# Patient Record
Sex: Male | Born: 1970 | Race: White | Hispanic: No | Marital: Single | Smoking: Never smoker
Health system: Southern US, Community
[De-identification: ages and names within clinical notes are randomized; demographics above are authoritative.]

## PROBLEM LIST (undated history)

## (undated) DIAGNOSIS — F419 Anxiety disorder, unspecified: Secondary | ICD-10-CM

## (undated) DIAGNOSIS — E7800 Pure hypercholesterolemia, unspecified: Secondary | ICD-10-CM

## (undated) DIAGNOSIS — I10 Essential (primary) hypertension: Secondary | ICD-10-CM

## (undated) HISTORY — PX: COLONOSCOPY: SHX174

## (undated) HISTORY — PX: ARTHROSCOPIC REPAIR ACL: SUR80

---

## 2018-12-11 ENCOUNTER — Encounter: Payer: Self-pay | Admitting: Podiatry

## 2018-12-11 ENCOUNTER — Other Ambulatory Visit: Payer: Self-pay

## 2018-12-11 ENCOUNTER — Ambulatory Visit (INDEPENDENT_AMBULATORY_CARE_PROVIDER_SITE_OTHER): Payer: No Typology Code available for payment source | Admitting: Podiatry

## 2018-12-11 VITALS — BP 131/93 | HR 61 | Temp 97.7°F

## 2018-12-11 DIAGNOSIS — E669 Obesity, unspecified: Secondary | ICD-10-CM | POA: Insufficient documentation

## 2018-12-11 DIAGNOSIS — L600 Ingrowing nail: Secondary | ICD-10-CM

## 2018-12-11 DIAGNOSIS — B351 Tinea unguium: Secondary | ICD-10-CM

## 2018-12-11 DIAGNOSIS — I10 Essential (primary) hypertension: Secondary | ICD-10-CM | POA: Insufficient documentation

## 2018-12-11 DIAGNOSIS — E785 Hyperlipidemia, unspecified: Secondary | ICD-10-CM | POA: Insufficient documentation

## 2018-12-11 MED ORDER — TERBINAFINE HCL 250 MG PO TABS: 250.0000 mg | ORAL_TABLET | Freq: Every day | ORAL | 0 refills | Status: DC

## 2018-12-11 MED ORDER — NEOMYCIN-POLYMYXIN-HC 1 % OT SOLN: OTIC | 1 refills | Status: AC

## 2018-12-11 MED ORDER — NEOMYCIN-POLYMYXIN-HC 3.5-10000-1 OP SUSP: OPHTHALMIC | 1 refills | Status: DC

## 2018-12-11 NOTE — Progress Notes (Signed)
   Subjective:    Patient ID: James Caldwell, male    DOB: 10-22-1970, 48 y.o.   MRN: [id number]  HPI    Review of Systems  All other systems reviewed and are negative.      Objective:   Physical Exam        Assessment & Plan:

## 2018-12-11 NOTE — Patient Instructions (Signed)

## 2018-12-15 NOTE — Progress Notes (Signed)
Subjective:   Patient ID: James Caldwell, male   DOB: 48 y.o.   MRN: [id number]   HPI Patient presents stating I have severely thickened big toenails on both feet and my second right is thickened and painful when pressed.  Patient is states they have all been bothersome and he is tried to trim them himself without relief and had 1 previous nail removed    Review of Systems  All other systems reviewed and are negative.       Objective:  Physical Exam Vitals signs and nursing note reviewed.  Constitutional:      Appearance: He is well-developed.  Pulmonary:     Effort: Pulmonary effort is normal.  Musculoskeletal: Normal range of motion.  Skin:    General: Skin is warm.  Neurological:     Mental Status: He is alert.     Neurovascular status intact muscle strength adequate range of motion within normal limits with patient noted to have severely thickened hallux nails bilateral and second right with subungual debris and pain with palpation.     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  I explained procedure risk and he wants surgery and today I infiltrated the left hallux 6 mg like Marcaine mixture in the right hallux second toes 120 mg like Marcaine mixture sterile preps applied to the toe and using sterile instrumentation I removed the hallux nail bilateral second right exposed matrix and applied phenol 5 applications 30 seconds followed by alcohol by sterile dressing.  Gave instructions on soaks and to leave dressings on 24 hours unless they should start to throb and remove them earlier and gave prescriptions for drops and encouraged him to call with questions

## 2019-01-01 ENCOUNTER — Other Ambulatory Visit: Payer: Self-pay

## 2019-01-01 ENCOUNTER — Ambulatory Visit (INDEPENDENT_AMBULATORY_CARE_PROVIDER_SITE_OTHER): Payer: No Typology Code available for payment source | Admitting: Podiatry

## 2019-01-01 DIAGNOSIS — L600 Ingrowing nail: Secondary | ICD-10-CM

## 2019-01-01 NOTE — Patient Instructions (Signed)

## 2019-02-05 NOTE — Progress Notes (Signed)
Patient is here today for follow-up appointment, recent procedure performed on 12/11/2018, removal of ingrown toenails.  He states that he feels like everything is healing well and his toes are starting to feel better.  No redness, no erythema, no swelling, no drainage, no other signs and symptoms of infection.  Area is scabbing over at this time and appears to be healing well.  Discussed signs and symptoms of infection with patient, verbal and written instructions were given.  He is to follow-up as needed with any acute symptom changes.

## 2021-06-13 ENCOUNTER — Emergency Department (HOSPITAL_BASED_OUTPATIENT_CLINIC_OR_DEPARTMENT_OTHER): Payer: No Typology Code available for payment source

## 2021-06-13 ENCOUNTER — Emergency Department (HOSPITAL_BASED_OUTPATIENT_CLINIC_OR_DEPARTMENT_OTHER)
Admission: EM | Admit: 2021-06-13 | Discharge: 2021-06-13 | Disposition: A | Discharge status: Home / Self Care | Payer: No Typology Code available for payment source | Attending: Emergency Medicine | Admitting: Emergency Medicine

## 2021-06-13 ENCOUNTER — Encounter (HOSPITAL_BASED_OUTPATIENT_CLINIC_OR_DEPARTMENT_OTHER): Payer: Self-pay | Admitting: Emergency Medicine

## 2021-06-13 ENCOUNTER — Other Ambulatory Visit: Payer: Self-pay

## 2021-06-13 ENCOUNTER — Emergency Department (HOSPITAL_BASED_OUTPATIENT_CLINIC_OR_DEPARTMENT_OTHER): Payer: No Typology Code available for payment source | Admitting: Radiology

## 2021-06-13 DIAGNOSIS — L03115 Cellulitis of right lower limb: Secondary | ICD-10-CM | POA: Insufficient documentation

## 2021-06-13 DIAGNOSIS — L0390 Cellulitis, unspecified: Secondary | ICD-10-CM

## 2021-06-13 DIAGNOSIS — Z20822 Contact with and (suspected) exposure to covid-19: Secondary | ICD-10-CM | POA: Diagnosis not present

## 2021-06-13 DIAGNOSIS — J069 Acute upper respiratory infection, unspecified: Secondary | ICD-10-CM | POA: Diagnosis not present

## 2021-06-13 DIAGNOSIS — I10 Essential (primary) hypertension: Secondary | ICD-10-CM | POA: Insufficient documentation

## 2021-06-13 DIAGNOSIS — R0602 Shortness of breath: Secondary | ICD-10-CM | POA: Diagnosis present

## 2021-06-13 DIAGNOSIS — R519 Headache, unspecified: Secondary | ICD-10-CM | POA: Diagnosis not present

## 2021-06-13 HISTORY — DX: Pure hypercholesterolemia, unspecified: E78.00

## 2021-06-13 HISTORY — DX: Essential (primary) hypertension: I10

## 2021-06-13 LAB — CBC WITH DIFFERENTIAL/PLATELET
Abs Immature Granulocytes: 0.04 10*3/uL (ref 0.00–0.07)
Basophils Absolute: 0.1 10*3/uL (ref 0.0–0.1)
Basophils Relative: 1 %
Eosinophils Absolute: 0.1 10*3/uL (ref 0.0–0.5)
Eosinophils Relative: 1 %
HCT: 41.5 % (ref 39.0–52.0)
Hemoglobin: 14.8 g/dL (ref 13.0–17.0)
Immature Granulocytes: 0 %
Lymphocytes Relative: 24 %
Lymphs Abs: 2.2 10*3/uL (ref 0.7–4.0)
MCH: 31 pg (ref 26.0–34.0)
MCHC: 35.7 g/dL (ref 30.0–36.0)
MCV: 87 fL (ref 80.0–100.0)
Monocytes Absolute: 0.7 10*3/uL (ref 0.1–1.0)
Monocytes Relative: 8 %
Neutro Abs: 6.2 10*3/uL (ref 1.7–7.7)
Neutrophils Relative %: 66 %
Platelets: 216 10*3/uL (ref 150–400)
RBC: 4.77 MIL/uL (ref 4.22–5.81)
RDW: 12.3 % (ref 11.5–15.5)
WBC: 9.3 10*3/uL (ref 4.0–10.5)
nRBC: 0 % (ref 0.0–0.2)

## 2021-06-13 LAB — BASIC METABOLIC PANEL
Anion gap: 7 (ref 5–15)
BUN: 12 mg/dL (ref 6–20)
CO2: 30 mmol/L (ref 22–32)
Calcium: 9.2 mg/dL (ref 8.9–10.3)
Chloride: 103 mmol/L (ref 98–111)
Creatinine, Ser: 0.95 mg/dL (ref 0.61–1.24)
GFR, Estimated: >60 mL/min (ref >60)
Glucose, Bld: 85 mg/dL (ref 70–99)
Potassium: 4 mmol/L (ref 3.5–5.1)
Sodium: 140 mmol/L (ref 135–145)

## 2021-06-13 LAB — RESP PANEL BY RT-PCR (FLU A&B, COVID) ARPGX2
Influenza A by PCR: NEGATIVE
Influenza B by PCR: NEGATIVE
SARS Coronavirus 2 by RT PCR: NEGATIVE

## 2021-06-13 MED ORDER — ACETAMINOPHEN 325 MG PO TABS
650.0000 mg | ORAL_TABLET | Freq: Once | ORAL | Status: AC
  Administered 2021-06-13: 650 mg via ORAL
  Filled 2021-06-13: qty 2

## 2021-06-13 MED ORDER — KETOROLAC TROMETHAMINE 15 MG/ML IJ SOLN
15.0000 mg | Freq: Once | INTRAMUSCULAR | Status: AC
Start: 2021-06-13 — End: 2021-06-13
  Administered 2021-06-13: 15 mg via INTRAVENOUS
  Filled 2021-06-13: qty 1

## 2021-06-13 MED ORDER — DOXYCYCLINE HYCLATE 100 MG PO CAPS: 100.0000 mg | ORAL_CAPSULE | Freq: Two times a day (BID) | ORAL | 0 refills | Status: AC

## 2021-06-13 MED ORDER — CEPHALEXIN 500 MG PO CAPS: 500.0000 mg | ORAL_CAPSULE | Freq: Four times a day (QID) | ORAL | 0 refills | Status: AC

## 2021-06-13 MED ORDER — SODIUM CHLORIDE 0.9 % IV SOLN
2.0000 g | Freq: Once | INTRAVENOUS | Status: AC
  Administered 2021-06-13: 2 g via INTRAVENOUS
  Filled 2021-06-13: qty 20

## 2021-06-13 MED ORDER — NAPROXEN 500 MG PO TABS: 500.0000 mg | ORAL_TABLET | Freq: Two times a day (BID) | ORAL | 0 refills | Status: AC

## 2021-06-13 NOTE — ED Notes (Signed)
EMT-P provided AVS using Teachback Method. Patient verbalizes understanding of Discharge Instructions. Opportunity for Questioning and Answers were provided by EMT-P. Patient Discharged from ED.  ? ?

## 2021-06-13 NOTE — ED Triage Notes (Addendum)
Rt ankle pain and cough  ear pain an congestion x 2 weeks, coming from the [facility]  and his bp was high so was sent over here for further teststing

## 2021-06-13 NOTE — Discharge Instructions (Addendum)
Please follow-up in 2 days for reassessment of your skin infection.  Please take antibiotics as prescribed.  Keep the leg elevated and you can use ice packs for swelling.  Use the ice packs for 20 minutes at a time and do not apply directly to the skin.  If you have any new or worsening symptoms in the meantime including any persistent fevers, severe pain, changes in sensation to your foot or leg, or any new or worsening concerns please return to the emergency department immediately.

## 2021-06-13 NOTE — ED Provider Notes (Signed)
[facility]   CSN: [id number] Arrival date & time: 06/13/21  1725     History  Chief Complaint  Patient presents with   Ankle Pain   Cough    James NeedleChristopher Caldwell is a 51 y.o. male.  HPI  51 year old male with history of hypertension, hyperlipidemia, obesity, presents the emergency department today with multiple complaints.  URI symptoms: Patient states he has had a cough for the last 3 weeks.  He also reports intermittent headaches, ear fullness.  He has been taking Mucinex which is somewhat improved his symptoms however symptoms returned a few days ago.  Leg pain: Patient reports that about 5 to 6 days ago he woke up with pain and swelling to the right lower extremity.  The pain is located mainly to the ankle and foot but actually radiates up his whole leg.  There are no reported sensory changes.  He denies any chest pain, shortness of breath.  He denies any history of DVT/PE, recent surgery or hospital admission.  He does report that he drives during extended period of time and least 3 hours a day from his commute to [place].  At times he does drive up to 7 hours/day.  Home Medications Prior to Admission medications   Medication Sig Start Date End Date Taking? Authorizing Provider  cephALEXin (KEFLEX) 500 MG capsule Take 1 capsule (500 mg total) by mouth 4 (four) times daily for 7 days. 06/13/21 06/20/21 Yes Alece Koppel S, PA-C  doxycycline (VIBRAMYCIN) 100 MG capsule Take 1 capsule (100 mg total) by mouth 2 (two) times daily for 7 days. 06/13/21 06/20/21 Yes Chiante Peden S, PA-C  naproxen (NAPROSYN) 500 MG tablet Take 1 tablet (500 mg total) by mouth 2 (two) times daily for 7 days. 06/13/21 06/20/21 Yes Orval Dortch S, PA-C  NEOMYCIN-POLYMYXIN-HYDROCORTISONE (CORTISPORIN) 1 % SOLN OTIC solution Apply 1-2 drops to toe BID after soaking 12/11/18   Regal, Kirstie PeriNorman S, DPM  terbinafine (LAMISIL) 250 MG tablet Take 1 tablet (250 mg total)  by mouth daily. 12/11/18   Lenn Sinkegal, Norman S, DPM      Allergies    Oxycodone-acetaminophen    Review of Systems   Review of Systems  Constitutional:  Positive for fever. Negative for chills.  HENT:  Positive for congestion, ear pain (fullness) and rhinorrhea. Negative for sore throat.   Eyes:  Negative for pain and visual disturbance.  Respiratory:  Positive for cough. Negative for shortness of breath.   Cardiovascular:  Positive for leg swelling. Negative for chest pain.  Gastrointestinal:  Negative for abdominal pain, constipation, diarrhea, nausea and vomiting.  Genitourinary:  Negative for dysuria and hematuria.  Musculoskeletal:        Right leg pain  Skin:  Positive for color change. Negative for rash and wound.  Neurological:  Negative for weakness and numbness.  All other systems reviewed and are negative.  Physical Exam Updated Vital Signs BP (!) 151/100    Pulse 83    Temp (!) 100.6 F (38.1 C) (Oral)    Resp 14    Ht 5\' 8"  (1.727 m)    Wt 107 kg    SpO2 99%    BMI 35.88 kg/m  Physical Exam Vitals and nursing note reviewed.  Constitutional:      General: He is not in acute distress.    Appearance: He is well-developed.  HENT:     Head: Normocephalic and atraumatic.     Right Ear: Tympanic membrane normal.  Left Ear: Tympanic membrane normal.  Eyes:     Conjunctiva/sclera: Conjunctivae normal.  Cardiovascular:     Rate and Rhythm: Normal rate and regular rhythm.     Pulses:          Dorsalis pedis pulses are 2+ on the right side and 2+ on the left side.     Heart sounds: Normal heart sounds. No murmur heard. Pulmonary:     Effort: Pulmonary effort is normal. No respiratory distress.     Breath sounds: Normal breath sounds. No wheezing, rhonchi or rales.  Abdominal:     General: Bowel sounds are normal.     Palpations: Abdomen is soft.     Tenderness: There is no abdominal tenderness. There is no guarding or rebound.  Musculoskeletal:     Cervical back: Neck  supple.     Right lower leg: Edema present.     Comments: Erythema located to the right lateral ankle there migrates to the midfoot.  There is a small superficial abrasion to the dorsum of the right foot.  Skin:    General: Skin is warm and dry.     Capillary Refill: Capillary refill takes less than 2 seconds.  Neurological:     Mental Status: He is alert.  Psychiatric:        Mood and Affect: Mood normal.    ED Results / Procedures / Treatments   Labs (all labs ordered are listed, but only abnormal results are displayed) Labs Reviewed  RESP PANEL BY RT-PCR (FLU A&B, COVID) [id number]  CBC WITH DIFFERENTIAL/PLATELET  BASIC METABOLIC PANEL    EKG None  Radiology DG Chest 2 View  Result Date: 06/13/2021 CLINICAL DATA:  Provided history: Shortness of breath. Additional history provided: Patient reports right ankle pain and cough, ear pain and congestion for 2 weeks. EXAM: CHEST - 2 VIEW COMPARISON:  Prior chest radiographs 10/22/2004 (images available, report unavailable). FINDINGS: Heart size within normal limits. No appreciable airspace consolidation. No evidence of pleural effusion or pneumothorax. No acute bony abnormality identified. Degenerative changes of the spine. IMPRESSION: No evidence of active cardiopulmonary disease. Electronically Signed   By: Kellie Simmering D.O.   On: 06/13/2021 18:21   US Venous Img Lower Right (DVT Study)  Result Date: 06/13/2021 CLINICAL DATA:  Right ankle pain and edema, hypertension EXAM: RIGHT LOWER EXTREMITY VENOUS DOPPLER ULTRASOUND TECHNIQUE: Gray-scale sonography with compression, as well as color and duplex ultrasound, were performed to evaluate the deep venous system(s) from the level of the common femoral vein through the popliteal and proximal calf veins. COMPARISON:  None. FINDINGS: VENOUS Normal compressibility of the common femoral, superficial femoral, and popliteal veins, as well as the visualized calf veins. Visualized portions of profunda  femoral vein and great saphenous vein unremarkable. No filling defects to suggest DVT on grayscale or color Doppler imaging. Doppler waveforms show normal direction of venous flow, normal respiratory plasticity and response to augmentation. Limited views of the contralateral common femoral vein are unremarkable. OTHER None. Limitations: none IMPRESSION: No evidence of deep venous thrombosis in the right lower extremity. Electronically Signed   By: Ilona Sorrel M.D.   On: 06/13/2021 19:48    Procedures Procedures    Medications Ordered in ED Medications  cefTRIAXone (ROCEPHIN) 2 g in sodium chloride 0.9 % 100 mL IVPB (2 g Intravenous New Bag/Given 06/13/21 2031)  acetaminophen (TYLENOL) tablet 650 mg (has no administration in time range)  ketorolac (TORADOL) 15 MG/ML injection 15 mg (has no administration in time  range)    ED Course/ Medical Decision Making/ A&P                           Medical Decision Making  51 year old male presents for evaluation of multiple complaints including URI symptoms and right lower extremity swelling and redness.  Reviewed/interpreted labs CBC is without leukocytosis or anemia BMP is grossly reassuring  Reviewed x-rays that were completed urgent care prior to arrival and there is no bony abnormalities to the foot or ankle noted.  He additionally did not have any fluid noted within the ankle joint on x-ray.  Right lower extremity ultrasound did not show any evidence of DVT.  Patient symptoms are most consistent with a cellulitis.  He has no crepitus on exam to suggest a necrotizing infection and pain is not out of proportion to exam.  His compartments are soft.  His DP pulses are intact and I doubt arterial emergency.  Furthermore I doubt septic arthritis cellulitis seems to surround an abrasion to the dorsum of his foot I suspect this is where his sxs originated from.  He was given a dose of ceftriaxone in the emergency department and will discharge him with  doxycycline and Keflex.  This will also cover for potential sinus infection given his prolonged URI symptoms and fever.  He is very well-appearing on exam and I have low suspicion for sepsis or overwhelming infection at this time.  I do feel it is appropriate to trial him on outpatient antibiotic therapy.  Advised him to follow-up in 48 hours for recheck of his infection and return to ED for any new or worsening symptoms.  Feels comfortable with this plan and is in agreement to follow-up and return if worse.  All questions were answered.  Patient able for discharge.   Final Clinical Impression(s) / ED Diagnoses Final diagnoses:  SOB (shortness of breath)  Cellulitis, unspecified cellulitis site  Upper respiratory tract infection, unspecified type    Rx / DC Orders ED Discharge Orders          Ordered    doxycycline (VIBRAMYCIN) 100 MG capsule  2 times daily        06/13/21 2043    cephALEXin (KEFLEX) 500 MG capsule  4 times daily        06/13/21 2043    naproxen (NAPROSYN) 500 MG tablet  2 times daily        06/13/21 2055              Bishop Dublin 06/13/21 2055    Lacretia Leigh, MD 06/13/21 2300

## 2023-01-14 IMAGING — DX DG CHEST 2V
2 series · 2 of 2 positions shown · non-contrast
Comparison: Prior chest radiographs 10/22/2004 (images available,
report unavailable).

CLINICAL DATA: Provided history: Shortness of breath. Additional
history provided: Patient reports right ankle pain and cough, ear
pain and congestion for 2 weeks.

EXAM:
CHEST - 2 VIEW

[chest lat]
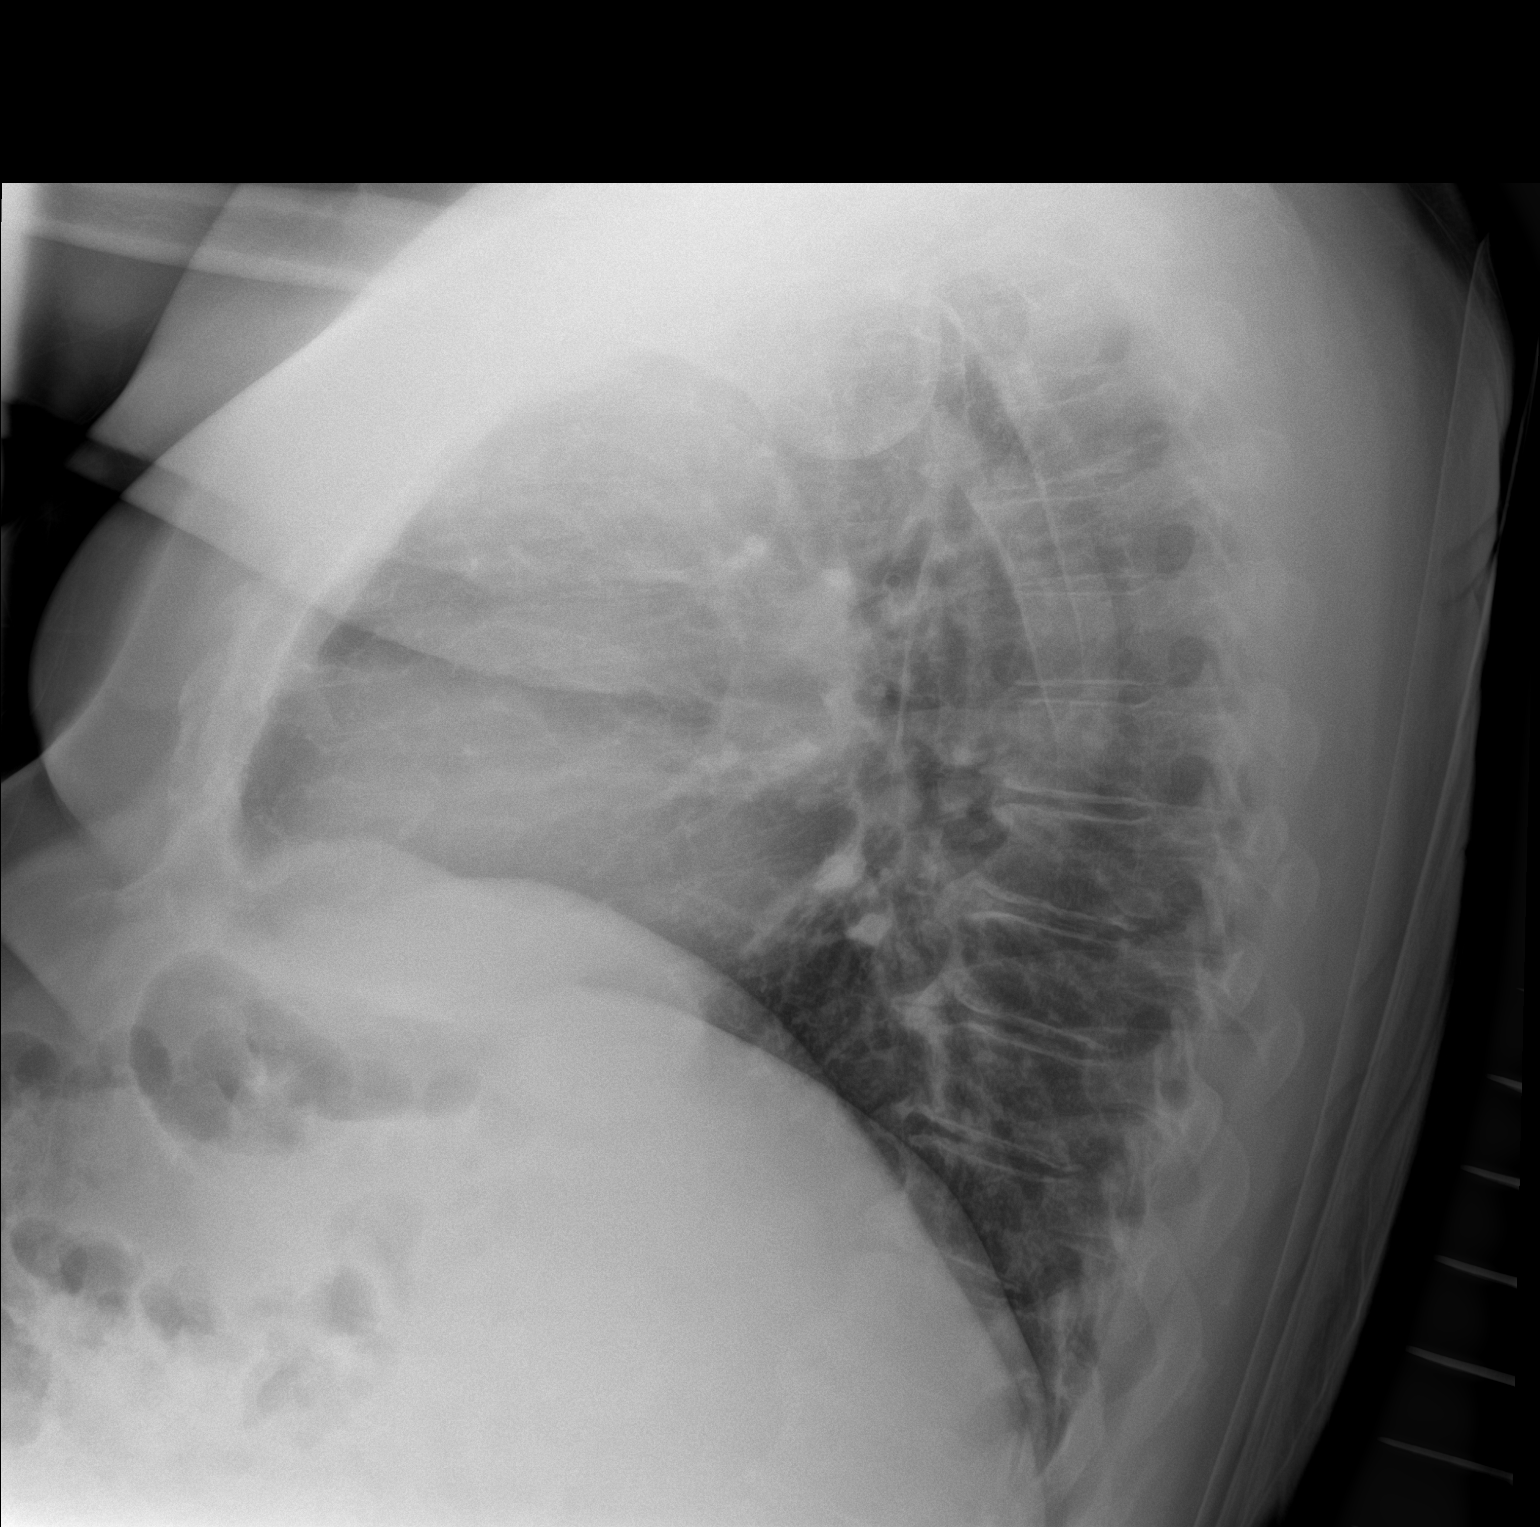

[chest ap]
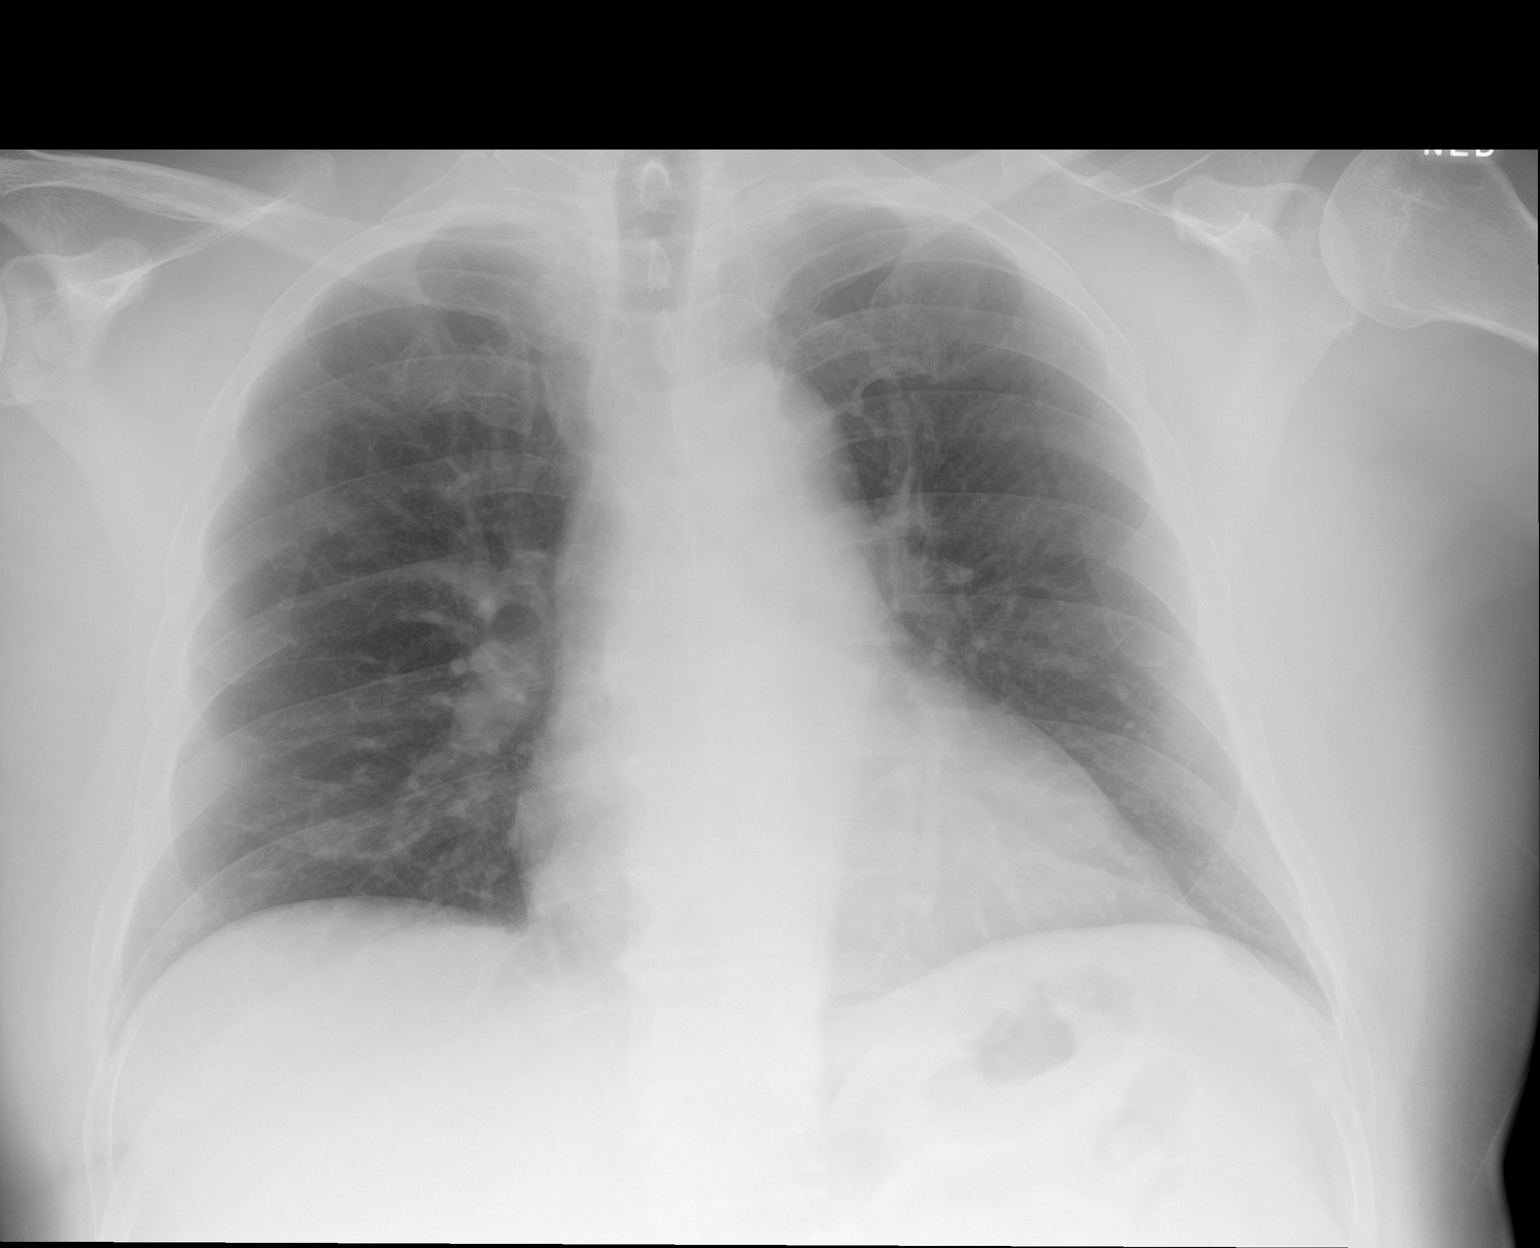

[2 of 2 positions shown; findings below may reference images not displayed]

FINDINGS: Heart size within normal limits. No appreciable airspace
consolidation. No evidence of pleural effusion or pneumothorax. No
acute bony abnormality identified. Degenerative changes of the
spine.
IMPRESSION: No evidence of active cardiopulmonary disease.

## 2023-01-14 IMAGING — US US EXTREM LOW VENOUS*R*
1 series · 14 of 24 positions shown · non-contrast
Comparison: None.

CLINICAL DATA: Right ankle pain and edema, hypertension

EXAM:
RIGHT LOWER EXTREMITY VENOUS DOPPLER ULTRASOUND
TECHNIQUE: Gray-scale sonography with compression, as well as color and duplex
ultrasound, were performed to evaluate the deep venous system(s)
from the level of the common femoral vein through the popliteal and
proximal calf veins.

[Series 1: us venous img lower uni right (dvt) · portal-venous · 14 of 33 slices shown]
[im 1/33]
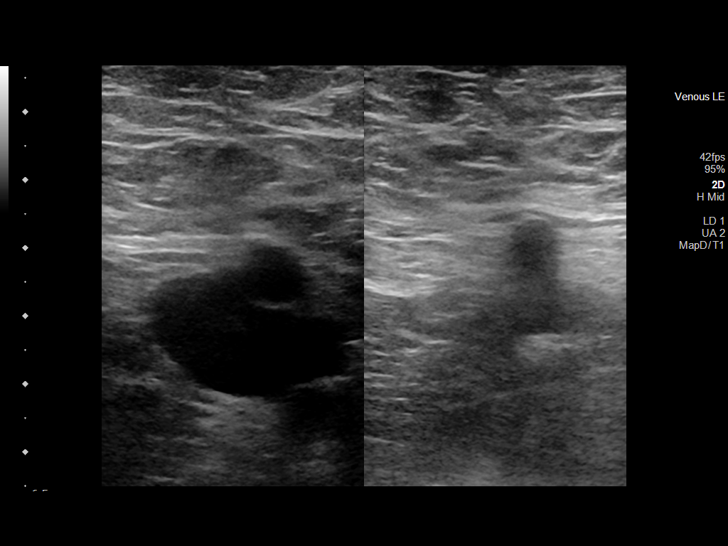
[im 3/33]
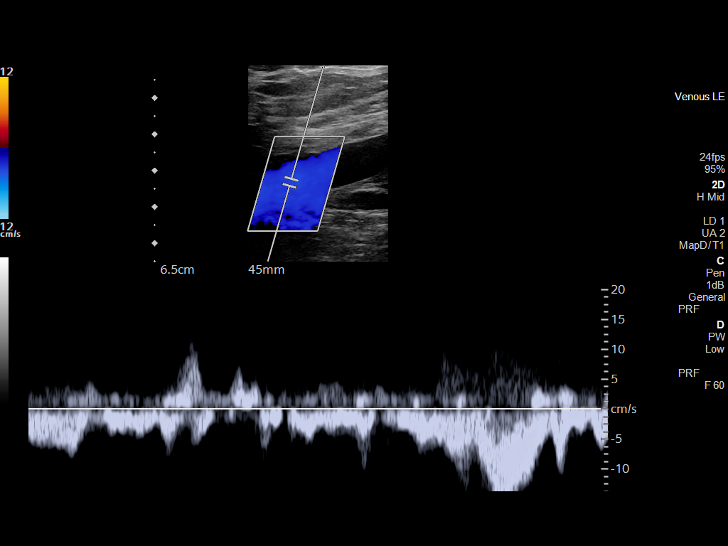
[im 6/33]
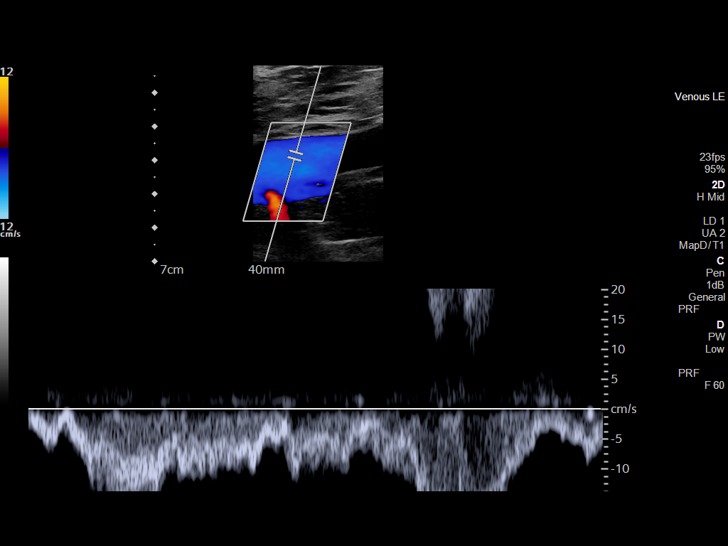
[im 9/33]
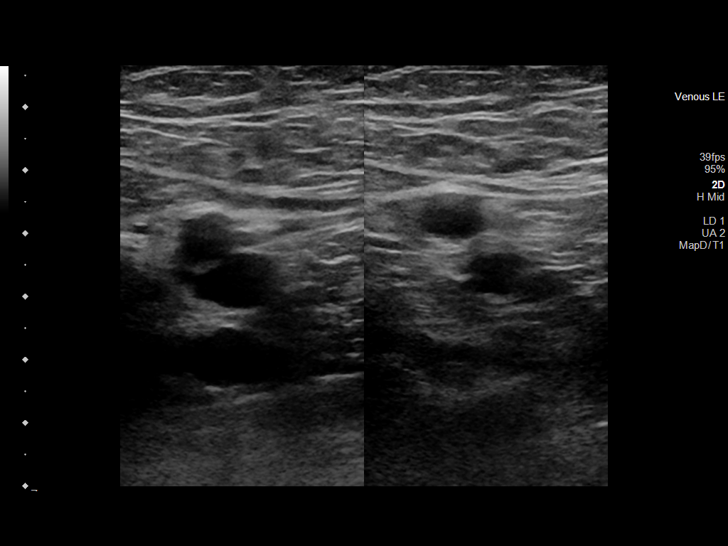
[im 10/33]
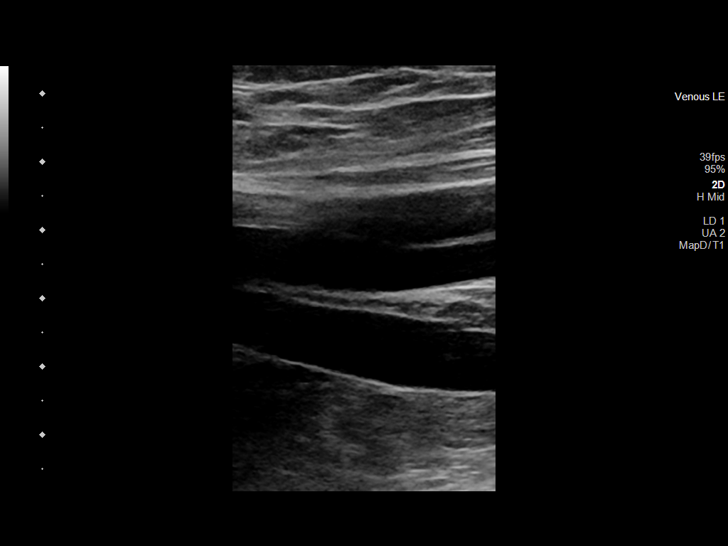
[im 13/33]
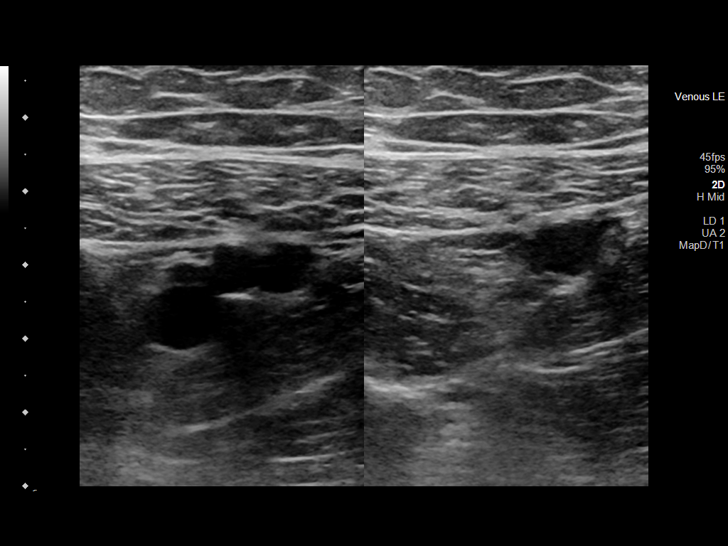
[im 16/33]
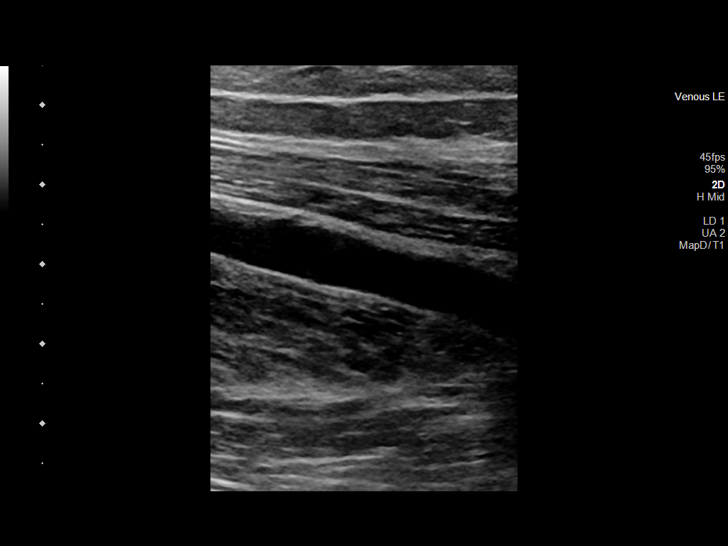
[im 17/33]
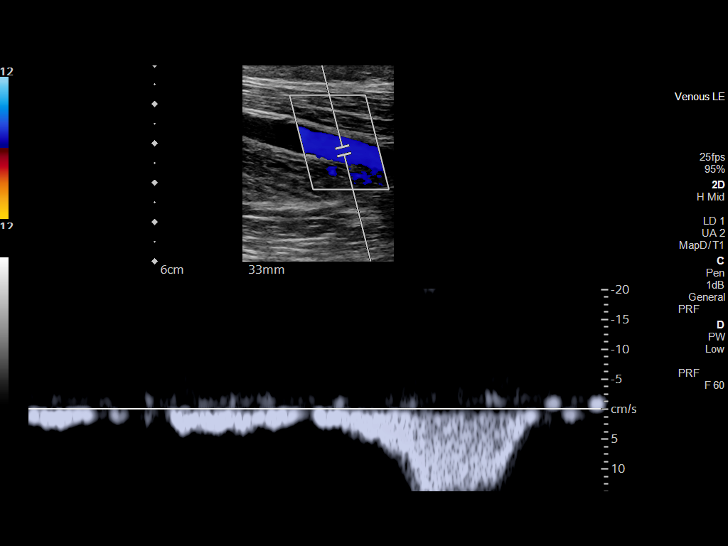
[im 20/33]
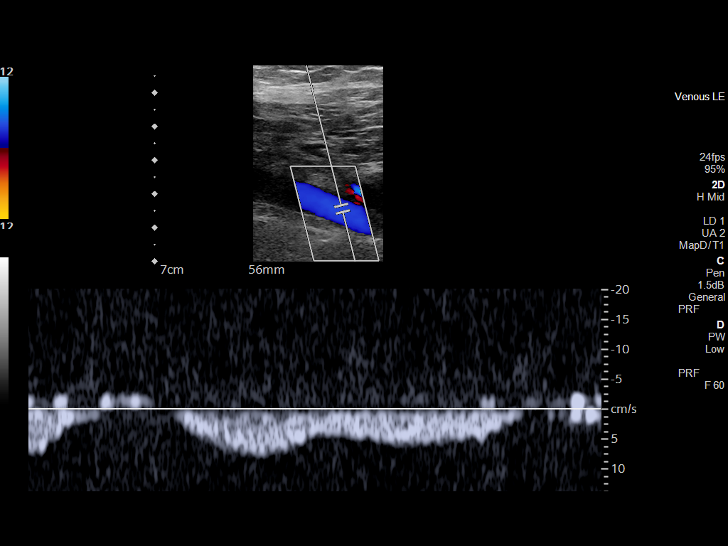
[im 23/33]
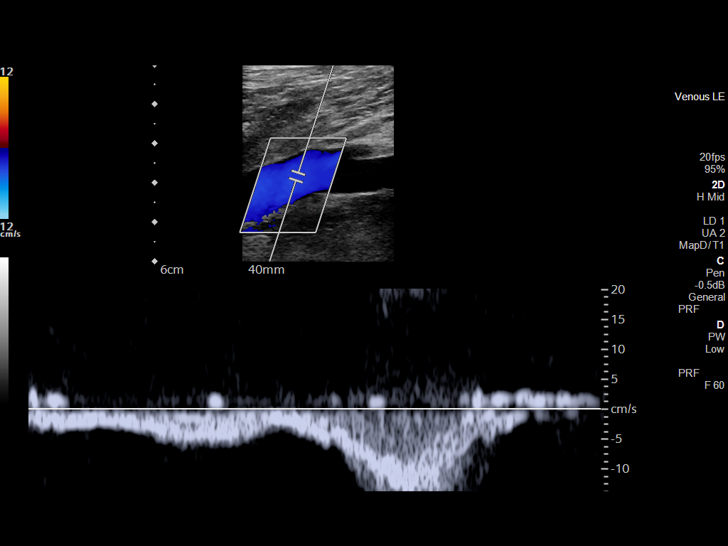
[im 26/33]
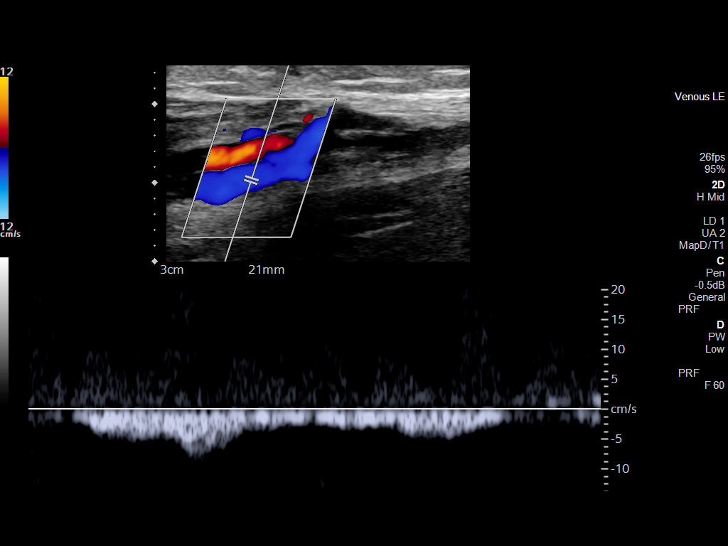
[im 27/33]
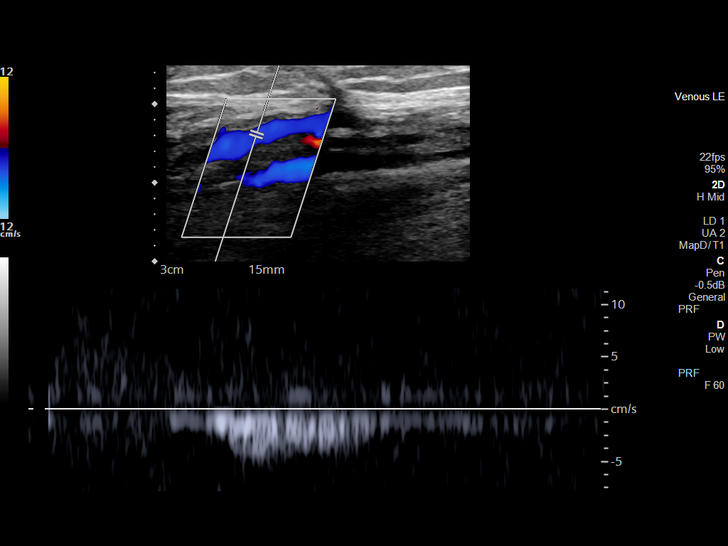
[im 30/33]
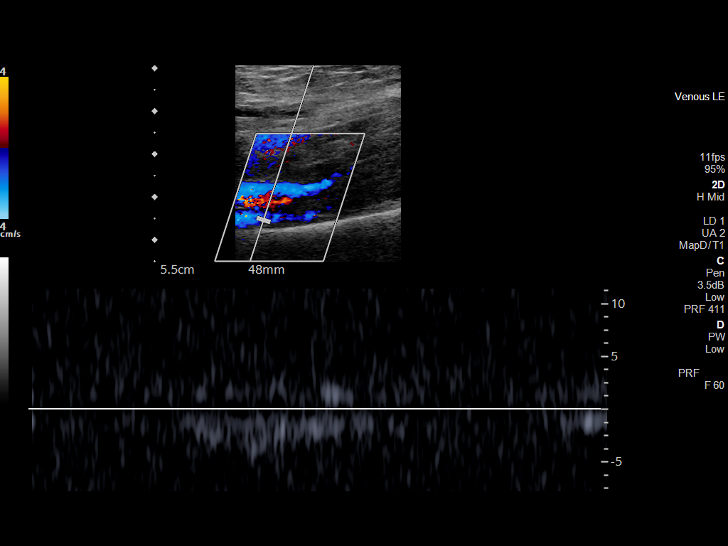
[im 33/33]
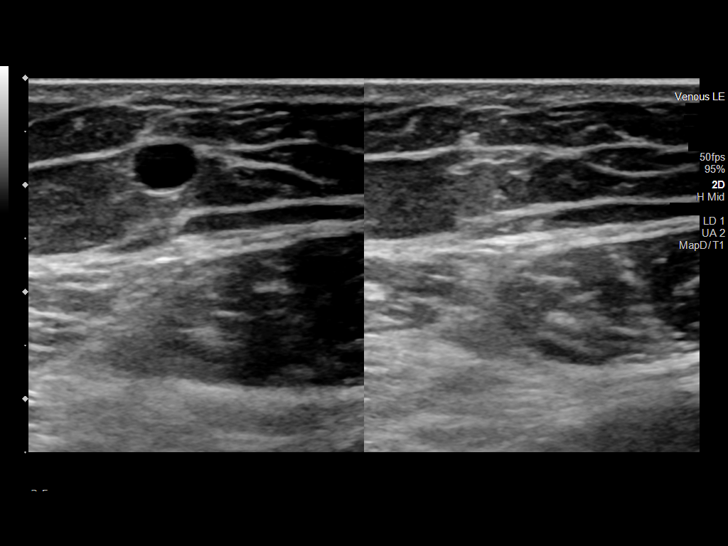

[14 of 24 positions shown; findings below may reference images not displayed]

FINDINGS: VENOUS

Normal compressibility of the common femoral, superficial femoral,
and popliteal veins, as well as the visualized calf veins.
Visualized portions of profunda femoral vein and great saphenous
vein unremarkable. No filling defects to suggest DVT on grayscale or
color Doppler imaging. Doppler waveforms show normal direction of
venous flow, normal respiratory plasticity and response to
augmentation.

Limited views of the contralateral common femoral vein are
unremarkable.

OTHER

None.

Limitations: none
IMPRESSION: No evidence of deep venous thrombosis in the right lower extremity.

## 2023-09-16 ENCOUNTER — Ambulatory Visit: Payer: Self-pay | Admitting: General Surgery

## 2023-09-16 NOTE — Progress Notes (Signed)
 Surgery orders requested via Epic inbox.

## 2023-09-22 NOTE — Progress Notes (Signed)
 COVID Vaccine Completed: yes  Date of COVID positive in last 90 days:  PCP -  Cardiologist -   Chest x-ray -  EKG - 08/23/23 [facility] Stress Test -  ECHO - 04/10/17 [facility] Cardiac Cath -  Pacemaker/ICD device last checked: Spinal Cord Stimulator:  Bowel Prep -   Sleep Study -  CPAP -   Fasting Blood Sugar -  Checks Blood Sugar _____ times a day  Last dose of GLP1 agonist-  N/A GLP1 instructions:  Hold 7 days before surgery    Last dose of SGLT-2 inhibitors-  N/A SGLT-2 instructions:  Hold 3 days before surgery    Blood Thinner Instructions:  Last dose:   Time: Aspirin Instructions: Last Dose:  Activity level:  Can go up a flight of stairs and perform activities of daily living without stopping and without symptoms of chest pain or shortness of breath.  Able to exercise without symptoms  Unable to go up a flight of stairs without symptoms of     Anesthesia review:   Patient denies shortness of breath, fever, cough and chest pain at PAT appointment  Patient verbalized understanding of instructions that were given to them at the PAT appointment. Patient was also instructed that they will need to review over the PAT instructions again at home before surgery.

## 2023-09-22 NOTE — Patient Instructions (Signed)
 SURGICAL WAITING ROOM VISITATION  Patients having surgery or a procedure may have no more than 2 support people in the waiting area - these visitors may rotate.    Children under the age of 48 must have an adult with them who is not the patient.  Due to an increase in RSV and influenza rates and associated hospitalizations, children ages 21 and under may not visit patients in [facility] hospitals.  Visitors with respiratory illnesses are discouraged from visiting and should remain at home.  If the patient needs to stay at the hospital during part of their recovery, the visitor guidelines for inpatient rooms apply. Pre-op nurse will coordinate an appropriate time for 1 support person to accompany patient in pre-op.  This support person may not rotate.    Please refer to the [facility] website for the visitor guidelines for Inpatients (after your surgery is over and you are in a regular room).    Your procedure is scheduled on: 10/03/23   Report to [facility] Main Entrance    Report to admitting at 10:45 AM   Call this number if you have problems the morning of surgery [phone]   Do not eat food :After Midnight.   After Midnight you may have the following liquids until 10:00 AM DAY OF SURGERY  Water Non-Citrus Juices (without pulp, NO RED-Apple, White grape, White cranberry) Black Coffee (NO MILK/CREAM OR CREAMERS, sugar ok)  Clear Tea (NO MILK/CREAM OR CREAMERS, sugar ok) regular and decaf                             Plain Jell-O (NO RED)                                           Fruit ices (not with fruit pulp, NO RED)                                     Popsicles (NO RED)                                                               Sports drinks like Gatorade (NO RED)                     If you have questions, please contact your surgeon's office.   FOLLOW BOWEL PREP AND ANY ADDITIONAL PRE OP INSTRUCTIONS YOU RECEIVED FROM YOUR SURGEON'S OFFICE!!!     Oral  Hygiene is also important to reduce your risk of infection.                                    Remember - BRUSH YOUR TEETH THE MORNING OF SURGERY WITH YOUR REGULAR TOOTHPASTE  DENTURES WILL BE REMOVED PRIOR TO SURGERY PLEASE DO NOT APPLY "Poly grip" OR ADHESIVES!!!   Stop all vitamins and herbal supplements 7 days before surgery.   Take these medicines the morning of surgery with A SIP OF WATER: Gabapentin, Norco,  Pravastatin                               You may not have any metal on your body including jewelry, and body piercing             Do not wear lotions, powders, cologne, or deodorant              Men may shave face and neck.   Do not bring valuables to the hospital. [place] IS NOT             RESPONSIBLE   FOR VALUABLES.   Contacts, glasses, dentures or bridgework may not be worn into surgery.  DO NOT BRING YOUR HOME MEDICATIONS TO THE HOSPITAL. PHARMACY WILL DISPENSE MEDICATIONS LISTED ON YOUR MEDICATION LIST TO YOU DURING YOUR ADMISSION IN THE HOSPITAL!    Patients discharged on the day of surgery will not be allowed to drive home.  Someone NEEDS to stay with you for the first 24 hours after anesthesia.              Please read over the following fact sheets you were given: IF YOU HAVE QUESTIONS ABOUT YOUR PRE-OP INSTRUCTIONS PLEASE CALL [name]   If you received a COVID test during your pre-op visit  it is requested that you wear a mask when out in public, stay away from anyone that may not be feeling well and notify your surgeon if you develop symptoms. If you test positive for Covid or have been in contact with anyone that has tested positive in the last 10 days please notify you surgeon.    [place] - Preparing for Surgery Before surgery, you can play an important role.  Because skin is not sterile, your skin needs to be as free of germs as possible.  You can reduce the number of germs on your skin by washing with CHG (chlorahexidine gluconate) soap  before surgery.  CHG is an antiseptic cleaner which kills germs and bonds with the skin to continue killing germs even after washing. Please DO NOT use if you have an allergy to CHG or antibacterial soaps.  If your skin becomes reddened/irritated stop using the CHG and inform your nurse when you arrive at Short Stay. Do not shave (including legs and underarms) for at least 48 hours prior to the first CHG shower.  You may shave your face/neck.  Please follow these instructions carefully:  1.  Shower with CHG Soap the night before surgery and the  morning of surgery.  2.  If you choose to wash your hair, wash your hair first as usual with your normal  shampoo.  3.  After you shampoo, rinse your hair and body thoroughly to remove the shampoo.                             4.  Use CHG as you would any other liquid soap.  You can apply chg directly to the skin and wash.  Gently with a scrungie or clean washcloth.  5.  Apply the CHG Soap to your body ONLY FROM THE NECK DOWN.   Do   not use on face/ open                           Wound or open sores. Avoid contact with eyes,  ears mouth and   genitals (private parts).                       Wash face,  Genitals (private parts) with your normal soap.             6.  Wash thoroughly, paying special attention to the area where your    surgery  will be performed.  7.  Thoroughly rinse your body with warm water from the neck down.  8.  DO NOT shower/wash with your normal soap after using and rinsing off the CHG Soap.                9.  Pat yourself dry with a clean towel.            10.  Wear clean pajamas.            11.  Place clean sheets on your bed the night of your first shower and do not  sleep with pets. Day of Surgery : Do not apply any lotions/deodorants the morning of surgery.  Please wear clean clothes to the hospital/surgery center.  FAILURE TO FOLLOW THESE INSTRUCTIONS MAY RESULT IN THE CANCELLATION OF YOUR SURGERY  PATIENT  SIGNATURE_________________________________  NURSE SIGNATURE__________________________________  ________________________________________________________________________

## 2023-09-23 ENCOUNTER — Other Ambulatory Visit: Payer: Self-pay

## 2023-09-23 ENCOUNTER — Encounter (HOSPITAL_COMMUNITY)
Admission: RE | Admit: 2023-09-23 | Discharge: 2023-09-23 | Disposition: A | Discharge status: Home / Self Care | Source: Ambulatory Visit | Attending: General Surgery | Admitting: General Surgery

## 2023-09-23 ENCOUNTER — Encounter (HOSPITAL_COMMUNITY): Payer: Self-pay

## 2023-09-23 VITALS — BP 120/87 | HR 64 | Temp 98.6°F | Resp 16 | Ht 68.0 in | Wt 211.0 lb

## 2023-09-23 DIAGNOSIS — Z01812 Encounter for preprocedural laboratory examination: Secondary | ICD-10-CM | POA: Diagnosis present

## 2023-09-23 DIAGNOSIS — I10 Essential (primary) hypertension: Secondary | ICD-10-CM | POA: Diagnosis not present

## 2023-09-23 HISTORY — DX: Anxiety disorder, unspecified: F41.9

## 2023-09-23 LAB — CBC
HCT: 50.2 % (ref 39.0–52.0)
Hemoglobin: 17.1 g/dL — ABNORMAL HIGH (ref 13.0–17.0)
MCH: 31 pg (ref 26.0–34.0)
MCHC: 34.1 g/dL (ref 30.0–36.0)
MCV: 91.1 fL (ref 80.0–100.0)
Platelets: 150 10*3/uL (ref 150–400)
RBC: 5.51 MIL/uL (ref 4.22–5.81)
RDW: 12.3 % (ref 11.5–15.5)
WBC: 4.5 10*3/uL (ref 4.0–10.5)
nRBC: 0 % (ref 0.0–0.2)

## 2023-09-23 LAB — BASIC METABOLIC PANEL WITH GFR
Anion gap: 8 (ref 5–15)
BUN: 14 mg/dL (ref 6–20)
CO2: 26 mmol/L (ref 22–32)
Calcium: 9.2 mg/dL (ref 8.9–10.3)
Chloride: 105 mmol/L (ref 98–111)
Creatinine, Ser: 0.78 mg/dL (ref 0.61–1.24)
GFR, Estimated: >60 mL/min (ref >60)
Glucose, Bld: 94 mg/dL (ref 70–99)
Potassium: 4.8 mmol/L (ref 3.5–5.1)
Sodium: 139 mmol/L (ref 135–145)

## 2023-10-03 ENCOUNTER — Encounter (HOSPITAL_COMMUNITY): Payer: Self-pay | Admitting: General Surgery

## 2023-10-03 ENCOUNTER — Encounter (HOSPITAL_COMMUNITY)
Admission: RE | Disposition: A | Discharge status: Home / Self Care | Payer: Self-pay | Source: Home / Self Care | Attending: General Surgery

## 2023-10-03 ENCOUNTER — Ambulatory Visit (HOSPITAL_COMMUNITY)

## 2023-10-03 ENCOUNTER — Ambulatory Visit (HOSPITAL_BASED_OUTPATIENT_CLINIC_OR_DEPARTMENT_OTHER)

## 2023-10-03 ENCOUNTER — Ambulatory Visit (HOSPITAL_COMMUNITY)
Admission: RE | Admit: 2023-10-03 | Discharge: 2023-10-03 | Disposition: A | Discharge status: Home / Self Care | Attending: General Surgery | Admitting: General Surgery

## 2023-10-03 DIAGNOSIS — Z885 Allergy status to narcotic agent status: Secondary | ICD-10-CM | POA: Diagnosis not present

## 2023-10-03 DIAGNOSIS — X58XXXA Exposure to other specified factors, initial encounter: Secondary | ICD-10-CM | POA: Diagnosis not present

## 2023-10-03 DIAGNOSIS — Z79899 Other long term (current) drug therapy: Secondary | ICD-10-CM | POA: Diagnosis not present

## 2023-10-03 DIAGNOSIS — K429 Umbilical hernia without obstruction or gangrene: Secondary | ICD-10-CM | POA: Insufficient documentation

## 2023-10-03 DIAGNOSIS — S32028A Other fracture of second lumbar vertebra, initial encounter for closed fracture: Secondary | ICD-10-CM | POA: Diagnosis not present

## 2023-10-03 DIAGNOSIS — I10 Essential (primary) hypertension: Secondary | ICD-10-CM

## 2023-10-03 DIAGNOSIS — S32018A Other fracture of first lumbar vertebra, initial encounter for closed fracture: Secondary | ICD-10-CM | POA: Insufficient documentation

## 2023-10-03 HISTORY — PX: UMBILICAL HERNIA REPAIR: SHX196

## 2023-10-03 SURGERY — REPAIR, HERNIA, UMBILICAL, ADULT
Anesthesia: General

## 2023-10-03 MED ORDER — KETAMINE HCL 10 MG/ML IJ SOLN
INTRAMUSCULAR | Status: DC | PRN
  Administered 2023-10-03: 20 mg via INTRAVENOUS
  Administered 2023-10-03: 10 mg via INTRAVENOUS

## 2023-10-03 MED ORDER — PROPOFOL 10 MG/ML IV BOLUS
INTRAVENOUS | Status: DC | PRN
  Administered 2023-10-03: 20 mg via INTRAVENOUS
  Administered 2023-10-03: 150 mg via INTRAVENOUS

## 2023-10-03 MED ORDER — FENTANYL CITRATE (PF) 100 MCG/2ML IJ SOLN
INTRAMUSCULAR | Status: DC | PRN
  Administered 2023-10-03 (×4): 50 ug via INTRAVENOUS

## 2023-10-03 MED ORDER — BUPIVACAINE-EPINEPHRINE (PF) 0.25% -1:200000 IJ SOLN
INTRAMUSCULAR | Status: DC | PRN
  Administered 2023-10-03: 30 mL

## 2023-10-03 MED ORDER — KETAMINE HCL 50 MG/5ML IJ SOSY
PREFILLED_SYRINGE | INTRAMUSCULAR | Status: AC
  Filled 2023-10-03: qty 5

## 2023-10-03 MED ORDER — EPHEDRINE 5 MG/ML INJ
INTRAVENOUS | Status: AC
  Filled 2023-10-03: qty 5

## 2023-10-03 MED ORDER — MIDAZOLAM HCL 2 MG/2ML IJ SOLN
INTRAMUSCULAR | Status: AC
  Filled 2023-10-03: qty 2

## 2023-10-03 MED ORDER — SUGAMMADEX SODIUM 200 MG/2ML IV SOLN
INTRAVENOUS | Status: DC | PRN
  Administered 2023-10-03: 200 mg via INTRAVENOUS

## 2023-10-03 MED ORDER — LIDOCAINE HCL (CARDIAC) PF 100 MG/5ML IV SOSY
PREFILLED_SYRINGE | INTRAVENOUS | Status: DC | PRN
  Administered 2023-10-03: 100 mg via INTRAVENOUS

## 2023-10-03 MED ORDER — AMISULPRIDE (ANTIEMETIC) 5 MG/2ML IV SOLN: 10.0000 mg | Freq: Once | INTRAVENOUS | Status: DC | PRN

## 2023-10-03 MED ORDER — EPHEDRINE SULFATE (PRESSORS) 50 MG/ML IJ SOLN
INTRAMUSCULAR | Status: DC | PRN
  Administered 2023-10-03 (×2): 5 mg via INTRAVENOUS
  Administered 2023-10-03: 10 mg via INTRAVENOUS
  Administered 2023-10-03 (×2): 5 mg via INTRAVENOUS

## 2023-10-03 MED ORDER — BUPIVACAINE-EPINEPHRINE (PF) 0.25% -1:200000 IJ SOLN
INTRAMUSCULAR | Status: AC
  Filled 2023-10-03: qty 30

## 2023-10-03 MED ORDER — ONDANSETRON HCL 4 MG/2ML IJ SOLN
INTRAMUSCULAR | Status: AC
  Filled 2023-10-03: qty 2

## 2023-10-03 MED ORDER — LACTATED RINGERS IV SOLN
INTRAVENOUS | Status: DC
Start: 2023-10-03 — End: 2023-10-03

## 2023-10-03 MED ORDER — PROPOFOL 10 MG/ML IV BOLUS
INTRAVENOUS | Status: AC
  Filled 2023-10-03: qty 20

## 2023-10-03 MED ORDER — HYDROMORPHONE HCL 1 MG/ML IJ SOLN: 0.2500 mg | INTRAMUSCULAR | Status: DC | PRN

## 2023-10-03 MED ORDER — FENTANYL CITRATE (PF) 100 MCG/2ML IJ SOLN
INTRAMUSCULAR | Status: AC
  Filled 2023-10-03: qty 2

## 2023-10-03 MED ORDER — ACETAMINOPHEN 500 MG PO TABS
1000.0000 mg | ORAL_TABLET | ORAL | Status: AC
Start: 2023-10-03 — End: 2023-10-03
  Administered 2023-10-03: 1000 mg via ORAL
  Filled 2023-10-03: qty 2

## 2023-10-03 MED ORDER — DEXAMETHASONE SODIUM PHOSPHATE 10 MG/ML IJ SOLN
INTRAMUSCULAR | Status: AC
  Filled 2023-10-03: qty 1

## 2023-10-03 MED ORDER — MEPERIDINE HCL 50 MG/ML IJ SOLN: 6.2500 mg | INTRAMUSCULAR | Status: DC | PRN

## 2023-10-03 MED ORDER — ROCURONIUM BROMIDE 100 MG/10ML IV SOLN
INTRAVENOUS | Status: DC | PRN
  Administered 2023-10-03: 10 mg via INTRAVENOUS
  Administered 2023-10-03: 70 mg via INTRAVENOUS

## 2023-10-03 MED ORDER — MIDAZOLAM HCL 5 MG/5ML IJ SOLN
INTRAMUSCULAR | Status: DC | PRN
  Administered 2023-10-03: 2 mg via INTRAVENOUS

## 2023-10-03 MED ORDER — DEXAMETHASONE SODIUM PHOSPHATE 10 MG/ML IJ SOLN
INTRAMUSCULAR | Status: DC | PRN
  Administered 2023-10-03: 8 mg via INTRAVENOUS

## 2023-10-03 MED ORDER — SODIUM CHLORIDE 0.9 % IR SOLN
Status: DC | PRN
  Administered 2023-10-03: 1000 mL

## 2023-10-03 MED ORDER — FENTANYL CITRATE (PF) 100 MCG/2ML IJ SOLN
INTRAMUSCULAR | Status: AC
Start: 2023-10-03 — End: ?
  Filled 2023-10-03: qty 2

## 2023-10-03 MED ORDER — CHLORHEXIDINE GLUCONATE CLOTH 2 % EX PADS: 6.0000 | MEDICATED_PAD | Freq: Once | CUTANEOUS | Status: DC

## 2023-10-03 MED ORDER — ORAL CARE MOUTH RINSE: 15.0000 mL | Freq: Once | OROMUCOSAL | Status: AC

## 2023-10-03 MED ORDER — GLYCOPYRROLATE 0.2 MG/ML IJ SOLN
INTRAMUSCULAR | Status: DC | PRN
  Administered 2023-10-03 (×2): .1 mg via INTRAVENOUS

## 2023-10-03 MED ORDER — PHENYLEPHRINE 80 MCG/ML (10ML) SYRINGE FOR IV PUSH (FOR BLOOD PRESSURE SUPPORT)
PREFILLED_SYRINGE | INTRAVENOUS | Status: AC
  Filled 2023-10-03: qty 10

## 2023-10-03 MED ORDER — PHENYLEPHRINE HCL (PRESSORS) 10 MG/ML IV SOLN
INTRAVENOUS | Status: DC | PRN
  Administered 2023-10-03: 80 ug via INTRAVENOUS

## 2023-10-03 MED ORDER — CEFAZOLIN SODIUM-DEXTROSE 2-4 GM/100ML-% IV SOLN
2.0000 g | INTRAVENOUS | Status: AC
  Administered 2023-10-03: 2 g via INTRAVENOUS
  Filled 2023-10-03: qty 100

## 2023-10-03 MED ORDER — ONDANSETRON HCL 4 MG/2ML IJ SOLN
INTRAMUSCULAR | Status: DC | PRN
  Administered 2023-10-03: 4 mg via INTRAVENOUS

## 2023-10-03 MED ORDER — SODIUM CHLORIDE 0.9 % IV SOLN: 12.5000 mg | INTRAVENOUS | Status: DC | PRN

## 2023-10-03 MED ORDER — HYDROCODONE-ACETAMINOPHEN 5-325 MG PO TABS: 1.0000 | ORAL_TABLET | Freq: Four times a day (QID) | ORAL | 0 refills | Status: AC | PRN

## 2023-10-03 MED ORDER — CHLORHEXIDINE GLUCONATE 0.12 % MT SOLN
15.0000 mL | Freq: Once | OROMUCOSAL | Status: AC
  Administered 2023-10-03: 15 mL via OROMUCOSAL

## 2023-10-03 MED ORDER — LIDOCAINE HCL (PF) 2 % IJ SOLN
INTRAMUSCULAR | Status: AC
  Filled 2023-10-03: qty 5

## 2023-10-03 SURGICAL SUPPLY — 37 items
BAG COUNTER SPONGE SURGICOUNT (BAG) IMPLANT
BENZOIN TINCTURE PRP APPL 2/3 (GAUZE/BANDAGES/DRESSINGS) IMPLANT
COVER SURGICAL LIGHT HANDLE (MISCELLANEOUS) ×1 IMPLANT
DRAIN CHANNEL 19F RND (DRAIN) IMPLANT
DRAPE LAPAROTOMY T 98X78 PEDS (DRAPES) IMPLANT
DRSG TEGADERM 4X4.75 (GAUZE/BANDAGES/DRESSINGS) IMPLANT
ELECT REM PT RETURN 15FT ADLT (MISCELLANEOUS) ×1 IMPLANT
EVACUATOR SILICONE 100CC (DRAIN) IMPLANT
GAUZE SPONGE 2X2 8PLY STRL LF (GAUZE/BANDAGES/DRESSINGS) IMPLANT
GAUZE SPONGE 4X4 12PLY STRL (GAUZE/BANDAGES/DRESSINGS) IMPLANT
GLOVE BIO SURGEON STRL SZ7.5 (GLOVE) ×1 IMPLANT
GLOVE INDICATOR 8.0 STRL GRN (GLOVE) ×1 IMPLANT
GOWN STRL REUS W/ TWL XL LVL3 (GOWN DISPOSABLE) ×1 IMPLANT
KIT BASIN OR (CUSTOM PROCEDURE TRAY) ×1 IMPLANT
KIT TURNOVER KIT A (KITS) IMPLANT
MESH VENTRALEX ST 2.5 CRC MED (Mesh General) IMPLANT
NDL HYPO 22X1.5 SAFETY MO (MISCELLANEOUS) IMPLANT
NEEDLE HYPO 22X1.5 SAFETY MO (MISCELLANEOUS) IMPLANT
NS IRRIG 1000ML POUR BTL (IV SOLUTION) ×1 IMPLANT
PACK GENERAL/GYN (CUSTOM PROCEDURE TRAY) ×1 IMPLANT
SPIKE FLUID TRANSFER (MISCELLANEOUS) ×1 IMPLANT
STAPLER SKIN PROX WIDE 3.9 (STAPLE) IMPLANT
STRIP CLOSURE SKIN 1/2X4 (GAUZE/BANDAGES/DRESSINGS) IMPLANT
SUT ETHILON 2 0 PS N (SUTURE) IMPLANT
SUT MNCRL AB 4-0 PS2 18 (SUTURE) IMPLANT
SUT NOVA 0 T19/GS 22DT (SUTURE) IMPLANT
SUT NOVA 1 T20/GS 25DT (SUTURE) IMPLANT
SUT NOVA NAB DX-16 0-1 5-0 T12 (SUTURE) IMPLANT
SUT PDS AB 1 TP1 96 (SUTURE) IMPLANT
SUT PROLENE 0 CT 1 CR/8 (SUTURE) IMPLANT
SUT VIC AB 2-0 SH 27X BRD (SUTURE) IMPLANT
SUT VIC AB 3-0 SH 18 (SUTURE) ×1 IMPLANT
SUT VIC AB 3-0 SH 27X BRD (SUTURE) IMPLANT
SYR CONTROL 10ML LL (SYRINGE) IMPLANT
TOWEL OR 17X26 10 PK STRL BLUE (TOWEL DISPOSABLE) ×1 IMPLANT
TRAY FOLEY MTR SLVR 14FR STAT (SET/KITS/TRAYS/PACK) IMPLANT
TRAY FOLEY MTR SLVR 16FR STAT (SET/KITS/TRAYS/PACK) IMPLANT

## 2023-10-03 NOTE — Transfer of Care (Signed)
 Immediate Anesthesia Transfer of Care Note  Patient: James Caldwell  Procedure(s) Performed: REPAIR, HERNIA, UMBILICAL, ADULT WITH MESH  Patient Location: PACU  Anesthesia Type:General  Level of Consciousness: awake, patient cooperative, and responds to stimulation  Airway & Oxygen Therapy: Patient Spontanous Breathing and Patient connected to face mask oxygen  Post-op Assessment: Report given to RN and Post -op Vital signs reviewed and stable  Post vital signs: Reviewed and stable  Last Vitals:  Vitals Value Taken Time  BP 125/78 10/03/23 1318  Temp    Pulse 76 10/03/23 1319  Resp 21 10/03/23 1319  SpO2 98 % 10/03/23 1319  Vitals shown include unfiled device data.  Last Pain:  Vitals:   10/03/23 1046  TempSrc:   PainSc: 4       Patients Stated Pain Goal: 5 (10/03/23 1046)  Complications: No notable events documented.

## 2023-10-03 NOTE — Discharge Instructions (Signed)
 UMBILICAL OR INGUINAL HERNIA REPAIR: POST OP INSTRUCTIONS  Always review your discharge instruction sheet given to you by the facility where your surgery was performed. IF YOU HAVE DISABILITY OR FAMILY LEAVE FORMS, YOU MUST BRING THEM TO THE OFFICE FOR PROCESSING.   DO NOT GIVE THEM TO YOUR DOCTOR.  A  prescription for pain medication may be given to you upon discharge.  Take your pain medication as prescribed, if needed.  If narcotic pain medicine is not needed, then you may take acetaminophen  (Tylenol ) or ibuprofen (Advil) as needed. Take your usually prescribed medications unless otherwise directed. If you need a refill on your pain medication, please contact your pharmacy.  They will contact our office to request authorization. Prescriptions will not be filled after 5 pm or on week-ends. You should follow a light diet the first 24 hours after arrival home, such as soup and crackers, etc.  Be sure to include lots of fluids daily.  Resume your normal diet the day after surgery. Most patients will experience some swelling and bruising around the umbilicus or in the groin and scrotum.  Ice packs and reclining will help.  Swelling and bruising can take several days to resolve.  It is common to experience some constipation if taking pain medication after surgery.  Increasing fluid intake and taking a stool softener (such as Colace) will usually help or prevent this problem from occurring.  A mild laxative (Milk of Magnesia or Miralax) should be taken according to package directions if there are no bowel movements after 48 hours. Unless discharge instructions indicate otherwise, you may remove your bandages 24-48 hours after surgery, and you may shower at that time.  You may have steri-strips (small skin tapes) in place directly over the incision.  These strips should be left on the skin for 7-10 days.  If your surgeon used skin glue on the incision, you may shower in 24 hours.  The glue will flake off  over the next 2-3 weeks.  Any sutures or staples will be removed at the office during your follow-up visit. ACTIVITIES:  You may resume regular (light) daily activities beginning the next day--such as daily self-care, walking, climbing stairs--gradually increasing activities as tolerated.  You may have sexual intercourse when it is comfortable.  Refrain from any heavy lifting or straining until approved by your doctor. You may drive when you are no longer taking prescription pain medication, you can comfortably wear a seatbelt, and you can safely maneuver your car and apply brakes. RETURN TO WORK:  You should see your doctor in the office for a follow-up appointment approximately 2-3 weeks after your surgery.  Make sure that you call for this appointment within a day or two after you arrive home to insure a convenient appointment time. OTHER INSTRUCTIONS: do not lift/push/pull anything greater than 10 lbs for 1 month    WHEN TO CALL YOUR DOCTOR: Fever over 101.0 Inability to urinate Nausea and/or vomiting Extreme swelling or bruising Continued bleeding from incision. Increased pain, redness, or drainage from the incision  The clinic staff is available to answer your questions during regular business hours.  Please don't hesitate to call and ask to speak to one of the nurses for clinical concerns.  If you have a medical emergency, go to the nearest emergency room or call 911.  A surgeon from [facility] is always on call at the hospital   [address], [address], [place], [place]  [address] ?  [address] [address], [place], [place]   [address] [phone] ? [phone] ? FAX [phone] Web site: [email or web address]
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
## 2023-10-03 NOTE — Anesthesia Postprocedure Evaluation (Signed)
 Anesthesia Post Note  Patient: James Caldwell  Procedure(s) Performed: REPAIR, HERNIA, UMBILICAL, ADULT WITH MESH     Patient location during evaluation: PACU Anesthesia Type: General Level of consciousness: awake and alert Pain management: pain level controlled Vital Signs Assessment: post-procedure vital signs reviewed and stable Respiratory status: spontaneous breathing, nonlabored ventilation and respiratory function stable Cardiovascular status: blood pressure returned to baseline and stable Postop Assessment: no apparent nausea or vomiting Anesthetic complications: no   No notable events documented.  Last Vitals:  Vitals:   10/03/23 1410 10/03/23 1415  BP: 109/76 118/67  Pulse: 61 63  Resp: 15 16  Temp:    SpO2: 91% 94%    Last Pain:  Vitals:   10/03/23 1427  TempSrc:   PainSc: 4                  Earvin Goldberg

## 2023-10-03 NOTE — Anesthesia Procedure Notes (Signed)
 Procedure Name: Intubation Date/Time: 10/03/2023 12:52 AM  Performed by: Terrilee Few, RNPre-anesthesia Checklist: Patient identified, Emergency Drugs available, Suction available, Patient being monitored and Timeout performed Patient Re-evaluated:Patient Re-evaluated prior to induction Oxygen Delivery Method: Circle system utilized Preoxygenation: Pre-oxygenation with 100% oxygen Induction Type: IV induction Ventilation: Mask ventilation without difficulty Laryngoscope Size: Mac and 4 Grade View: Grade I Tube type: Oral Tube size: 7.5 mm Number of attempts: 1 Airway Equipment and Method: Stylet Placement Confirmation: ETT inserted through vocal cords under direct vision, positive ETCO2 and breath sounds checked- equal and bilateral Secured at: 22 cm Tube secured with: Tape Dental Injury: Teeth and Oropharynx as per pre-operative assessment

## 2023-10-03 NOTE — Op Note (Signed)
   Initial Umbilical Herniorrhaphy with mesh Procedure Note  Indications: Symptomatic umbilical hernia 2 cm   Pre-operative Diagnosis: umbilical hernia 2 cm  Post-operative Diagnosis: umbilical hernia 2cm  Surgeon: Marianna Shirk. Elvan Hamel, MD FACS   Assistants: none   Anesthesia: General endotracheal anesthesia and Local anesthesia 0.25%marcaine  with epi    Procedure Details  The patient was seen in the Holding Room. The risks, benefits, complications, treatment options, and expected outcomes were discussed with the patient. The possibilities of reaction to medication, pulmonary aspiration, perforation of viscus, bleeding, recurrent infection, hernia recurrence, the need for additional procedures, failure to diagnose a condition, and creating a complication requiring transfusion or operation were discussed with the patient. The patient concurred with the proposed plan, giving informed consent. The site of surgery properly noted/marked.   The patient was taken to Operating Room # 1 at [facility], identified as James Caldwell  and the procedure verified as Umbilical Herniorrhaphy with possible mesh. A Time Out was held and the above information confirmed. The patient received IV antibiotics and oral tylenol  prior to the procedure.  He had a 2 cm umbilical hernia.  The patient was placed supine. After establishing general anesthesia, the abdomen was prepped and draped in standard fashion. 0.250% Marcaine  with epinephrine  was used to anesthetize the skin. A curvilinear infraumbilical incision was created. Dissection was carried down to the hernia sac located above the fascia and was mobilized from surrounding structures.  Hernia sac was excised and discarded.  The herniated omentum was reduced into the abdomen.  Intact fascia was identified circumferentially around the defect. Skin and soft tissue was mobilized from the surface of the fascia in a circumferential manner. A finger sweep was performed  underneath the fascia to ensure there were no adhesions to the anterior abdominal wall. A Bard Ventralex ST hernia patch 6.4 cm was then placed thru the defect. The tabs were lifted to ensure that the mesh was flush against the abdominal wall. I then ran my finger around the inside of the defect to ensure nothing was trapped between the mesh and the abdominal wall. A 0-novafil suture was used to secure the base of each tab to the fascia and then the excess tab was trimmed at the fascial level.  The fascia was then closed primarily over the mesh with 6 interrupted 0-novafil sutures. The cavity was irrigated and additional local was infiltrated in the subcutaneous tissue and fascia. The umbilical stalk was then tacked back down to the fascia with one 3-0 vicryl sutures. Hemostasis was confirmed. The soft tissue was irrigated and closed in layers with inverted interrupted 3-0 vicryl sutures for the deep dermis. The skin incision was closed with a 4-0 monocryl subcuticular closure. Steri-Strips followed by a 4x4 gauze and a tegaderm were applied at the end of the operation.   Instrument, sponge, and needle counts were correct prior to closure and at the conclusion of the case.   Findings:  2 cm umbilical hernia Initial repair Bard Ventralex ST hernia patch 6.4 cm  Estimated Blood Loss: Minimal   Drains: none   Implants: Bard Ventralex ST hernia patch 6.4 cm   Complications: None; patient tolerated the procedure well.   Disposition: PACU - hemodynamically stable.   Condition: stable  Marianna Shirk. Elvan Hamel, MD, FACS General, Bariatric, & Minimally Invasive Surgery [facility] [place]

## 2023-10-03 NOTE — Anesthesia Preprocedure Evaluation (Signed)
 Anesthesia Evaluation  Patient identified by MRN, date of birth, ID band Patient awake    Reviewed: Allergy & Precautions, H&P , NPO status , Patient's Chart, lab work & pertinent test results  Airway Mallampati: II  TM Distance: >3 FB Neck ROM: Full    Dental no notable dental hx.    Pulmonary neg pulmonary ROS   Pulmonary exam normal breath sounds clear to auscultation       Cardiovascular hypertension, Pt. on medications negative cardio ROS Normal cardiovascular exam Rhythm:Regular Rate:Normal     Neuro/Psych   Anxiety     negative neurological ROS  negative psych ROS   GI/Hepatic negative GI ROS, Neg liver ROS,,,  Endo/Other  negative endocrine ROS    Renal/GU negative Renal ROS  negative genitourinary   Musculoskeletal negative musculoskeletal ROS (+)    Abdominal  (+) + obese  Peds negative pediatric ROS (+)  Hematology negative hematology ROS (+)   Anesthesia Other Findings   Reproductive/Obstetrics negative OB ROS                             Anesthesia Physical Anesthesia Plan  ASA: 2  Anesthesia Plan: General   Post-op Pain Management: Tylenol  PO (pre-op)*   Induction: Intravenous  PONV Risk Score and Plan: 2 and Ondansetron , Midazolam  and Treatment may vary due to age or medical condition  Airway Management Planned: Oral ETT  Additional Equipment:   Intra-op Plan:   Post-operative Plan: Extubation in OR  Informed Consent: I have reviewed the patients History and Physical, chart, labs and discussed the procedure including the risks, benefits and alternatives for the proposed anesthesia with the patient or authorized representative who has indicated his/her understanding and acceptance.     Dental advisory given  Plan Discussed with: CRNA  Anesthesia Plan Comments:        Anesthesia Quick Evaluation

## 2023-10-06 ENCOUNTER — Other Ambulatory Visit: Payer: Self-pay

## 2023-10-06 ENCOUNTER — Encounter (HOSPITAL_COMMUNITY): Payer: Self-pay | Admitting: General Surgery
